# Patient Record
Sex: Male | Born: 1955
Health system: Southern US, Community
[De-identification: ages and names within clinical notes are randomized; demographics above are authoritative.]

## PROBLEM LIST (undated history)

## (undated) DIAGNOSIS — Z87448 Personal history of other diseases of urinary system: Secondary | ICD-10-CM

## (undated) HISTORY — DX: Personal history of other diseases of urinary system: Z87.448

## (undated) HISTORY — PX: EYE SURGERY: SHX253

---

## 2011-04-06 HISTORY — PX: FETAL SURGERY FOR CONGENITAL HERNIA: SHX1618

## 2015-08-29 DIAGNOSIS — L820 Inflamed seborrheic keratosis: Secondary | ICD-10-CM | POA: Diagnosis not present

## 2015-08-29 DIAGNOSIS — D225 Melanocytic nevi of trunk: Secondary | ICD-10-CM | POA: Diagnosis not present

## 2016-03-08 DIAGNOSIS — Z0001 Encounter for general adult medical examination with abnormal findings: Secondary | ICD-10-CM | POA: Diagnosis not present

## 2016-03-08 DIAGNOSIS — K5730 Diverticulosis of large intestine without perforation or abscess without bleeding: Secondary | ICD-10-CM | POA: Diagnosis not present

## 2016-03-08 DIAGNOSIS — R1031 Right lower quadrant pain: Secondary | ICD-10-CM | POA: Diagnosis not present

## 2016-03-08 DIAGNOSIS — E559 Vitamin D deficiency, unspecified: Secondary | ICD-10-CM | POA: Diagnosis not present

## 2016-03-08 DIAGNOSIS — N401 Enlarged prostate with lower urinary tract symptoms: Secondary | ICD-10-CM | POA: Diagnosis not present

## 2016-05-26 ENCOUNTER — Encounter (INDEPENDENT_AMBULATORY_CARE_PROVIDER_SITE_OTHER): Payer: Self-pay | Admitting: Orthopaedic Surgery

## 2016-05-26 ENCOUNTER — Ambulatory Visit (INDEPENDENT_AMBULATORY_CARE_PROVIDER_SITE_OTHER): Payer: BLUE CROSS/BLUE SHIELD | Admitting: Orthopaedic Surgery

## 2016-05-26 ENCOUNTER — Ambulatory Visit (INDEPENDENT_AMBULATORY_CARE_PROVIDER_SITE_OTHER): Payer: BLUE CROSS/BLUE SHIELD

## 2016-05-26 VITALS — Ht 72.0 in | Wt 180.0 lb

## 2016-05-26 DIAGNOSIS — M25561 Pain in right knee: Secondary | ICD-10-CM

## 2016-05-26 NOTE — Addendum Note (Signed)
Addended by: Maxcine Ham on: 05/26/2016 09:35 AM   Modules accepted: Orders

## 2016-05-26 NOTE — Progress Notes (Signed)
Office Visit Note   Patient: Devon Sawyer           Date of Birth: Jun 21, 1955           MRN: [id number] Visit Date: 05/26/2016              Requested by: No referring provider defined for this encounter. PCP: Henrine Screws, MD   Assessment & Plan: Visit Diagnoses:  1. Acute pain of right knee     Plan: Given the continued locking catching in the knee and the fact that his right knee does get stuck in the place an MRI is definitely warranted and medically necessary to rule out a meniscal tear. He had a large effusion about 3 weeks ago now it's gotten smaller with rest, ice, heat, time and anti-inflammatories however he still having the mechanical symptoms of locking and catching and feeling as if the knee is going to give way. Based on those findings as well as my clinical exam findings of a positive Murray sign to the lateral side we will obtain an MRI to rule out a meniscal tear.  Follow-Up Instructions: Return in about 2 weeks (around 06/09/2016).   Orders:  Orders Placed This Encounter  Procedures  . XR KNEE 3 VIEW RIGHT   No orders of the defined types were placed in this encounter.     Procedures: No procedures performed   Clinical Data: No additional findings.   Subjective: Chief Complaint  Patient presents with  . Right Knee - Pain    Prior meniscal tear injury when he was 61 y.o. No known recent injury. Popping and giving way when pivoting on right foot. Ongoing x 3 weeks. He complains of more discomfort than pain. Swelling.     HPI The patient reports a history of locking catching in his knee that occurred about 3 weeks ago. After this injury of the knee twisting on him the knee state a lot position until he could finally get it straight. This took several days for this to take place. The knee then popped in the place. He's had lateral joint line tenderness with episodes of locking catching available like the knee is going to give way. He also had report a  large effusion of that knee that is slowly gone down over the last 3 weeks however the knee still feels unstable to him. Review of Systems He denies any chest pain, headache, shortness of breath, fever, chills, nausea, vomiting.  Objective: Vital Signs: Ht 6' (1.829 m)   Wt 180 lb (81.6 kg)   BMI 24.41 kg/m   Physical Exam He is alert and oriented 3 and in no acute distress. He does walk with a limp. Ortho Exam Examination of both his knees is performed the office today. His right knee is slightly warm with a moderate effusion. He has a positive Murray sign to lateral side. His Lachman's is negative. His range of motion is full. Specialty Comments:  No specialty comments available.  Imaging: Xr Knee 3 View Right  Result Date: 05/26/2016 3 views of the right knee including AP lateral and sunrise views show well located knee. There is well-maintained joint spaces. There is no loose bodies and we can see. There is no acute bone injury or fracture.    PMFS History: There are no active problems to display for this patient.  Past Medical History:  Diagnosis Date  . H/O bladder problems     Family History  Problem Relation Age of Onset  .  Cancer Father     Past Surgical History:  Procedure Laterality Date  . EYE SURGERY  2016,2010,2005  . FETAL SURGERY FOR CONGENITAL HERNIA  2013   Social History   Occupational History  . Not on file.   Social History Main Topics  . Smoking status: Never Smoker  . Smokeless tobacco: Never Used  . Alcohol use Yes  . Drug use: No  . Sexual activity: Not on file

## 2016-06-08 ENCOUNTER — Ambulatory Visit
Admission: RE | Admit: 2016-06-08 | Discharge: 2016-06-08 | Disposition: A | Discharge status: Home / Self Care | Payer: BLUE CROSS/BLUE SHIELD | Source: Ambulatory Visit | Attending: Orthopaedic Surgery | Admitting: Orthopaedic Surgery

## 2016-06-08 DIAGNOSIS — M25561 Pain in right knee: Secondary | ICD-10-CM

## 2016-06-09 ENCOUNTER — Encounter (INDEPENDENT_AMBULATORY_CARE_PROVIDER_SITE_OTHER): Payer: Self-pay | Admitting: Orthopaedic Surgery

## 2016-06-09 ENCOUNTER — Ambulatory Visit (INDEPENDENT_AMBULATORY_CARE_PROVIDER_SITE_OTHER): Payer: BLUE CROSS/BLUE SHIELD | Admitting: Orthopaedic Surgery

## 2016-06-09 DIAGNOSIS — S83241A Other tear of medial meniscus, current injury, right knee, initial encounter: Secondary | ICD-10-CM | POA: Insufficient documentation

## 2016-06-09 NOTE — Progress Notes (Signed)
   Office Visit Note   Patient: Devon Sawyer           Date of Birth: 03-13-1956           MRN: [id number] Visit Date: 06/09/2016              Requested by: Josetta Huddle, MD [address], [place] [address] PCP: Henrine Screws, MD   Assessment & Plan: Visit Diagnoses:  1. Acute medial meniscus tear of right knee, initial encounter     Plan: Given that his right knee medial meniscal tear is so significant and it's such a large bucket-handle tear we are recommending an arthroscopic intervention. We went over her knee model showed him several knee images and videos of knee arthroscopic surgery on line for him to look at. With a long thorough discussion about the risks and benefits of surgery recommendation for this. I gave him our surgery scheduler's card and he'll let us know when he decides to have this done. We talked about the intraoperative and postoperative courses well.  Follow-Up Instructions: Return if symptoms worsen or fail to improve.   Orders:  No orders of the defined types were placed in this encounter.  No orders of the defined types were placed in this encounter.     Procedures: No procedures performed   Clinical Data: No additional findings.   Subjective: Chief Complaint  Patient presents with  . Right Knee - Follow-up    MRI review right knee     HPI He is coming in for review of MRI of his right knee. He been having pain on the medial joint line with locking catching of that knee. Review of Systems   Objective: Vital Signs: There were no vitals taken for this visit.  Physical Exam Alert and oriented 3 Ortho Exam The right knee has medial joint line tenderness and a positive Murray sign to the medial side. Specialty Comments:  No specialty comments available.  Imaging: No results found. I independently reviewed his right knee MRI and went over with him. He has a large bucket-handle complex comminuted tear of the  medial meniscus. The overlying cartilage in the medial compartment is intact and pristine. The remainder of his knee appears normal  PMFS History: Patient Active Problem List   Diagnosis Date Noted  . Acute medial meniscus tear of right knee 06/09/2016   Past Medical History:  Diagnosis Date  . H/O bladder problems     Family History  Problem Relation Age of Onset  . Cancer Father     Past Surgical History:  Procedure Laterality Date  . EYE SURGERY  2016,2010,2005  . FETAL SURGERY FOR CONGENITAL HERNIA  2013   Social History   Occupational History  . Not on file.   Social History Main Topics  . Smoking status: Never Smoker  . Smokeless tobacco: Never Used  . Alcohol use Yes  . Drug use: No  . Sexual activity: Not on file

## 2016-06-30 ENCOUNTER — Other Ambulatory Visit: Payer: Self-pay | Admitting: Gastroenterology

## 2016-07-01 ENCOUNTER — Other Ambulatory Visit: Payer: Self-pay | Admitting: Gastroenterology

## 2016-07-08 ENCOUNTER — Encounter (HOSPITAL_COMMUNITY): Payer: Self-pay

## 2016-07-08 ENCOUNTER — Ambulatory Visit (HOSPITAL_COMMUNITY): Admit: 2016-07-08 | Payer: BLUE CROSS/BLUE SHIELD | Admitting: Gastroenterology

## 2016-07-08 SURGERY — COLONOSCOPY WITH PROPOFOL
Anesthesia: Monitor Anesthesia Care

## 2016-08-05 ENCOUNTER — Encounter (HOSPITAL_COMMUNITY): Payer: Self-pay | Admitting: *Deleted

## 2016-08-09 ENCOUNTER — Ambulatory Visit (HOSPITAL_COMMUNITY)
Admission: RE | Admit: 2016-08-09 | Discharge: 2016-08-09 | Disposition: A | Discharge status: Home / Self Care | Payer: BLUE CROSS/BLUE SHIELD | Source: Ambulatory Visit | Attending: Gastroenterology | Admitting: Gastroenterology

## 2016-08-09 ENCOUNTER — Ambulatory Visit (HOSPITAL_COMMUNITY): Payer: BLUE CROSS/BLUE SHIELD | Admitting: Anesthesiology

## 2016-08-09 ENCOUNTER — Encounter (HOSPITAL_COMMUNITY): Payer: Self-pay | Admitting: *Deleted

## 2016-08-09 ENCOUNTER — Encounter (HOSPITAL_COMMUNITY)
Admission: RE | Disposition: A | Discharge status: Home / Self Care | Payer: Self-pay | Source: Ambulatory Visit | Attending: Gastroenterology

## 2016-08-09 DIAGNOSIS — L719 Rosacea, unspecified: Secondary | ICD-10-CM | POA: Diagnosis not present

## 2016-08-09 DIAGNOSIS — S83241A Other tear of medial meniscus, current injury, right knee, initial encounter: Secondary | ICD-10-CM | POA: Diagnosis not present

## 2016-08-09 DIAGNOSIS — Z1211 Encounter for screening for malignant neoplasm of colon: Secondary | ICD-10-CM | POA: Diagnosis not present

## 2016-08-09 DIAGNOSIS — N400 Enlarged prostate without lower urinary tract symptoms: Secondary | ICD-10-CM | POA: Insufficient documentation

## 2016-08-09 DIAGNOSIS — Z880 Allergy status to penicillin: Secondary | ICD-10-CM | POA: Insufficient documentation

## 2016-08-09 DIAGNOSIS — K5730 Diverticulosis of large intestine without perforation or abscess without bleeding: Secondary | ICD-10-CM | POA: Insufficient documentation

## 2016-08-09 HISTORY — PX: COLONOSCOPY WITH PROPOFOL: SHX5780

## 2016-08-09 SURGERY — COLONOSCOPY WITH PROPOFOL
Anesthesia: Monitor Anesthesia Care

## 2016-08-09 MED ORDER — LACTATED RINGERS IV SOLN
INTRAVENOUS | Status: DC
  Administered 2016-08-09: 1000 mL via INTRAVENOUS

## 2016-08-09 MED ORDER — PROPOFOL 10 MG/ML IV BOLUS
INTRAVENOUS | Status: AC
  Filled 2016-08-09: qty 40

## 2016-08-09 MED ORDER — PROPOFOL 10 MG/ML IV BOLUS
INTRAVENOUS | Status: DC | PRN
  Administered 2016-08-09: 10 mg via INTRAVENOUS
  Administered 2016-08-09: 40 mg via INTRAVENOUS

## 2016-08-09 MED ORDER — EPHEDRINE SULFATE-NACL 50-0.9 MG/10ML-% IV SOSY
PREFILLED_SYRINGE | INTRAVENOUS | Status: DC | PRN
  Administered 2016-08-09 (×5): 10 mg via INTRAVENOUS

## 2016-08-09 MED ORDER — LIDOCAINE 2% (20 MG/ML) 5 ML SYRINGE
INTRAMUSCULAR | Status: DC | PRN
  Administered 2016-08-09: 100 mg via INTRAVENOUS

## 2016-08-09 MED ORDER — SODIUM CHLORIDE 0.9 % IV SOLN: INTRAVENOUS | Status: DC

## 2016-08-09 MED ORDER — LIDOCAINE 2% (20 MG/ML) 5 ML SYRINGE
INTRAMUSCULAR | Status: AC
  Filled 2016-08-09: qty 5

## 2016-08-09 MED ORDER — PROPOFOL 500 MG/50ML IV EMUL
INTRAVENOUS | Status: DC | PRN
  Administered 2016-08-09: 150 ug/kg/min via INTRAVENOUS

## 2016-08-09 SURGICAL SUPPLY — 21 items

## 2016-08-09 NOTE — Op Note (Signed)
[facility] Patient Name: Devon Sawyer Procedure Date: 08/09/2016 MRN: [id number] Attending MD: Garlan Fair , MD Date of Birth: 03/29/1956 CSN: [id number] Age: 61 Admit Type: Outpatient Procedure:                Colonoscopy Indications:              Screening for colorectal malignant neoplasm Providers:                Garlan Fair, MD, Elmer Ramp. Tilden Dome, RN, Ralene Bathe, Technician Referring MD:              Medicines:                Propofol per Anesthesia Complications:            No immediate complications. Estimated Blood Loss:     Estimated blood loss: none. Procedure:                Pre-Anesthesia Assessment:                           - Prior to the procedure, a History and Physical                            was performed, and patient medications and                            allergies were reviewed. The patient's tolerance of                            previous anesthesia was also reviewed. The risks                            and benefits of the procedure and the sedation                            options and risks were discussed with the patient.                            All questions were answered, and informed consent                            was obtained. Prior Anticoagulants: The patient has                            taken no previous anticoagulant or antiplatelet                            agents. ASA Grade Assessment: II - A patient with                            mild systemic disease. After reviewing the risks  and benefits, the patient was deemed in                            satisfactory condition to undergo the procedure.                           After obtaining informed consent, the colonoscope                            was passed under direct vision. Throughout the                            procedure, the patient's blood pressure, pulse, and                            oxygen  saturations were monitored continuously. The                            EC-[id number] ([id number]) scope was introduced through                            the anus and advanced to the the cecum, identified                            by appendiceal orifice and ileocecal valve. The                            colonoscopy was performed without difficulty. The                            patient tolerated the procedure well. The quality                            of the bowel preparation was good. The terminal                            ileum, the ileocecal valve, the appendiceal orifice                            and the rectum were photographed. Scope In: 1:23:13 PM Scope Out: 1:44:40 PM Scope Withdrawal Time: 0 hours 10 minutes 55 seconds  Total Procedure Duration: 0 hours 21 minutes 27 seconds  Findings:      The perianal and digital rectal examinations were normal.      The entire examined colon appeared normal. Left colonic diverticulosis       was present. Impression:               - The entire examined colon is normal.                           - No specimens collected. Moderate Sedation:      N/A- Per Anesthesia Care Recommendation:           - Patient has a contact number available for  emergencies. The signs and symptoms of potential                            delayed complications were discussed with the                            patient. Return to normal activities tomorrow.                            Written discharge instructions were provided to the                            patient.                           - Repeat colonoscopy in 10 years for screening                            purposes.                           - Resume previous diet.                           - Continue present medications. Procedure Code(s):        --- Professional ---                           R1540, Colorectal cancer screening; colonoscopy on                             individual not meeting criteria for high risk Diagnosis Code(s):        --- Professional ---                           Z12.11, Encounter for screening for malignant                            neoplasm of colon CPT copyright 2016 American Medical Association. All rights reserved. The codes documented in this report are preliminary and upon coder review may  be revised to meet current compliance requirements. Earle Gell, MD Garlan Fair, MD 08/09/2016 1:50:45 PM This report has been signed electronically. Number of Addenda: 0

## 2016-08-09 NOTE — H&P (Signed)
Procedure: Screening colonoscopy. Normal screening colonoscopy was performed on 06/15/2006  History: The patient is a 61 year old male born 1956-01-16. He is scheduled to undergo a repeat screening colonoscopy today.  Past medical history: Benign prostatic hypertrophy. Probable sigmoid colon diverticulitis diagnosed in September 1999. Rosacea. Bilateral inguinal hernia repair surgery.  Family history: Father was diagnosed with prostate cancer  Medication allergies: Penicillin causes itching  Exam: The patient is alert and lying comfortably on the endoscopy stretcher. Abdomen is soft and nontender to palpation. Cardiac exam reveals a regular rhythm. Lungs are clear to auscultation.  Plan: Proceed with screening colonoscopy

## 2016-08-09 NOTE — Discharge Instructions (Signed)

## 2016-08-09 NOTE — Anesthesia Postprocedure Evaluation (Addendum)
Anesthesia Post Note  Patient: Devon Sawyer  Procedure(s) Performed: Procedure(s) (LRB): COLONOSCOPY WITH PROPOFOL (N/A)  Patient location during evaluation: PACU Anesthesia Type: MAC Level of consciousness: awake, awake and alert and oriented Pain management: pain level controlled Vital Signs Assessment: post-procedure vital signs reviewed and stable Respiratory status: spontaneous breathing, nonlabored ventilation and respiratory function stable Cardiovascular status: blood pressure returned to baseline Anesthetic complications: no       Last Vitals:  Vitals:   08/09/16 1410 08/09/16 1414  BP: (!) 113/52   Pulse: 83 79  Resp: 17 15  Temp:      Last Pain:  Vitals:   08/09/16 1356  TempSrc: Oral                 Kees Idrovo,Leovanni COKER

## 2016-08-09 NOTE — Transfer of Care (Signed)
Immediate Anesthesia Transfer of Care Note  Patient: Devon Sawyer  Procedure(s) Performed: Procedure(s): COLONOSCOPY WITH PROPOFOL (N/A)  Patient Location: PACU  Anesthesia Type:MAC  Level of Consciousness: awake, alert  and oriented  Airway & Oxygen Therapy: Patient Spontanous Breathing and Patient connected to face mask oxygen  Post-op Assessment: Report given to RN and Post -op Vital signs reviewed and stable  Post vital signs: Reviewed and stable  Last Vitals:  Vitals:   08/09/16 1234 08/09/16 1356  BP: 129/82 (!) 105/48  Pulse: 72   Resp: 11 16  Temp: 36.4 C 36.3 C    Last Pain:  Vitals:   08/09/16 1356  TempSrc: Oral         Complications: No apparent anesthesia complications

## 2016-08-09 NOTE — Anesthesia Preprocedure Evaluation (Signed)
Anesthesia Evaluation  Patient identified by MRN, date of birth, ID band Patient awake    Reviewed: Allergy & Precautions, NPO status , Patient's Chart, lab work & pertinent test results  Airway Mallampati: II  TM Distance: >3 FB Neck ROM: Full    Dental  (+) Teeth Intact, Dental Advisory Given   Pulmonary    breath sounds clear to auscultation       Cardiovascular  Rhythm:Regular Rate:Normal     Neuro/Psych    GI/Hepatic   Endo/Other    Renal/GU      Musculoskeletal   Abdominal   Peds  Hematology   Anesthesia Other Findings   Reproductive/Obstetrics                             Anesthesia Physical Anesthesia Plan  ASA: I  Anesthesia Plan: MAC   Post-op Pain Management:    Induction: Intravenous  Airway Management Planned: Simple Face Mask and Natural Airway  Additional Equipment:   Intra-op Plan:   Post-operative Plan:   Informed Consent: I have reviewed the patients History and Physical, chart, labs and discussed the procedure including the risks, benefits and alternatives for the proposed anesthesia with the patient or authorized representative who has indicated his/her understanding and acceptance.     Plan Discussed with: CRNA and Anesthesiologist  Anesthesia Plan Comments:         Anesthesia Quick Evaluation

## 2016-08-12 ENCOUNTER — Encounter (HOSPITAL_COMMUNITY): Payer: Self-pay | Admitting: Gastroenterology

## 2016-11-25 NOTE — Addendum Note (Signed)
Addendum  created 11/25/16 1412 by Roberts Gaudy, MD   Sign clinical note

## 2017-01-26 DIAGNOSIS — Z1283 Encounter for screening for malignant neoplasm of skin: Secondary | ICD-10-CM | POA: Diagnosis not present

## 2017-01-26 DIAGNOSIS — D225 Melanocytic nevi of trunk: Secondary | ICD-10-CM | POA: Diagnosis not present

## 2017-03-18 DIAGNOSIS — N401 Enlarged prostate with lower urinary tract symptoms: Secondary | ICD-10-CM | POA: Diagnosis not present

## 2017-03-18 DIAGNOSIS — Z13220 Encounter for screening for lipoid disorders: Secondary | ICD-10-CM | POA: Diagnosis not present

## 2017-03-18 DIAGNOSIS — Z0001 Encounter for general adult medical examination with abnormal findings: Secondary | ICD-10-CM | POA: Diagnosis not present

## 2017-03-18 DIAGNOSIS — E559 Vitamin D deficiency, unspecified: Secondary | ICD-10-CM | POA: Diagnosis not present

## 2017-03-18 DIAGNOSIS — Z79899 Other long term (current) drug therapy: Secondary | ICD-10-CM | POA: Diagnosis not present

## 2017-03-18 DIAGNOSIS — K4021 Bilateral inguinal hernia, without obstruction or gangrene, recurrent: Secondary | ICD-10-CM | POA: Diagnosis not present

## 2017-03-18 DIAGNOSIS — K5730 Diverticulosis of large intestine without perforation or abscess without bleeding: Secondary | ICD-10-CM | POA: Diagnosis not present

## 2018-03-08 DIAGNOSIS — E559 Vitamin D deficiency, unspecified: Secondary | ICD-10-CM | POA: Diagnosis not present

## 2018-03-08 DIAGNOSIS — N401 Enlarged prostate with lower urinary tract symptoms: Secondary | ICD-10-CM | POA: Diagnosis not present

## 2018-03-08 DIAGNOSIS — Z8042 Family history of malignant neoplasm of prostate: Secondary | ICD-10-CM | POA: Diagnosis not present

## 2018-03-08 DIAGNOSIS — Z13220 Encounter for screening for lipoid disorders: Secondary | ICD-10-CM | POA: Diagnosis not present

## 2018-03-08 DIAGNOSIS — Z0001 Encounter for general adult medical examination with abnormal findings: Secondary | ICD-10-CM | POA: Diagnosis not present

## 2018-06-20 DIAGNOSIS — R509 Fever, unspecified: Secondary | ICD-10-CM | POA: Diagnosis not present

## 2018-06-20 DIAGNOSIS — R05 Cough: Secondary | ICD-10-CM | POA: Diagnosis not present

## 2018-06-29 DIAGNOSIS — R05 Cough: Secondary | ICD-10-CM | POA: Diagnosis not present

## 2018-07-12 DIAGNOSIS — D485 Neoplasm of uncertain behavior of skin: Secondary | ICD-10-CM | POA: Diagnosis not present

## 2018-07-12 DIAGNOSIS — D225 Melanocytic nevi of trunk: Secondary | ICD-10-CM | POA: Diagnosis not present

## 2018-07-12 DIAGNOSIS — L821 Other seborrheic keratosis: Secondary | ICD-10-CM | POA: Diagnosis not present

## 2018-08-09 DIAGNOSIS — R05 Cough: Secondary | ICD-10-CM | POA: Diagnosis not present

## 2018-11-05 DIAGNOSIS — T63481A Toxic effect of venom of other arthropod, accidental (unintentional), initial encounter: Secondary | ICD-10-CM | POA: Diagnosis not present

## 2019-03-21 ENCOUNTER — Other Ambulatory Visit: Payer: Self-pay | Admitting: Internal Medicine

## 2019-03-21 ENCOUNTER — Ambulatory Visit
Admission: RE | Admit: 2019-03-21 | Discharge: 2019-03-21 | Disposition: A | Discharge status: Home / Self Care | Payer: BLUE CROSS/BLUE SHIELD | Source: Ambulatory Visit | Attending: Internal Medicine | Admitting: Internal Medicine

## 2019-03-21 DIAGNOSIS — Z0001 Encounter for general adult medical examination with abnormal findings: Secondary | ICD-10-CM | POA: Diagnosis not present

## 2019-03-21 DIAGNOSIS — Z23 Encounter for immunization: Secondary | ICD-10-CM | POA: Diagnosis not present

## 2019-03-21 DIAGNOSIS — M4802 Spinal stenosis, cervical region: Secondary | ICD-10-CM | POA: Diagnosis not present

## 2019-03-21 DIAGNOSIS — R002 Palpitations: Secondary | ICD-10-CM | POA: Diagnosis not present

## 2019-03-21 DIAGNOSIS — M5412 Radiculopathy, cervical region: Secondary | ICD-10-CM

## 2019-03-21 DIAGNOSIS — Z125 Encounter for screening for malignant neoplasm of prostate: Secondary | ICD-10-CM | POA: Diagnosis not present

## 2019-03-21 DIAGNOSIS — E559 Vitamin D deficiency, unspecified: Secondary | ICD-10-CM | POA: Diagnosis not present

## 2019-03-21 DIAGNOSIS — R209 Unspecified disturbances of skin sensation: Secondary | ICD-10-CM | POA: Diagnosis not present

## 2019-03-21 DIAGNOSIS — Z79899 Other long term (current) drug therapy: Secondary | ICD-10-CM | POA: Diagnosis not present

## 2019-03-21 DIAGNOSIS — Z8042 Family history of malignant neoplasm of prostate: Secondary | ICD-10-CM | POA: Diagnosis not present

## 2019-03-21 DIAGNOSIS — K4021 Bilateral inguinal hernia, without obstruction or gangrene, recurrent: Secondary | ICD-10-CM | POA: Diagnosis not present

## 2019-03-21 DIAGNOSIS — I341 Nonrheumatic mitral (valve) prolapse: Secondary | ICD-10-CM | POA: Diagnosis not present

## 2019-03-21 DIAGNOSIS — Z13220 Encounter for screening for lipoid disorders: Secondary | ICD-10-CM | POA: Diagnosis not present

## 2019-03-22 ENCOUNTER — Other Ambulatory Visit: Payer: Self-pay | Admitting: Internal Medicine

## 2019-03-22 ENCOUNTER — Other Ambulatory Visit (HOSPITAL_COMMUNITY): Payer: Self-pay | Admitting: Internal Medicine

## 2019-03-22 DIAGNOSIS — R079 Chest pain, unspecified: Secondary | ICD-10-CM

## 2019-03-22 DIAGNOSIS — I341 Nonrheumatic mitral (valve) prolapse: Secondary | ICD-10-CM

## 2019-04-02 ENCOUNTER — Ambulatory Visit (HOSPITAL_COMMUNITY): Payer: PPO | Attending: Cardiology

## 2019-04-02 ENCOUNTER — Other Ambulatory Visit: Payer: PPO

## 2019-04-02 ENCOUNTER — Other Ambulatory Visit: Payer: Self-pay

## 2019-04-02 DIAGNOSIS — I341 Nonrheumatic mitral (valve) prolapse: Secondary | ICD-10-CM | POA: Insufficient documentation

## 2019-04-03 ENCOUNTER — Ambulatory Visit
Admission: RE | Admit: 2019-04-03 | Discharge: 2019-04-03 | Disposition: A | Discharge status: Home / Self Care | Payer: PPO | Source: Ambulatory Visit | Attending: Internal Medicine | Admitting: Internal Medicine

## 2019-04-03 DIAGNOSIS — R079 Chest pain, unspecified: Secondary | ICD-10-CM

## 2019-05-26 ENCOUNTER — Ambulatory Visit: Payer: PPO | Attending: Internal Medicine

## 2019-05-26 DIAGNOSIS — Z23 Encounter for immunization: Secondary | ICD-10-CM

## 2019-05-26 NOTE — Progress Notes (Signed)
   Covid-19 Vaccination Clinic  Name:  Devon Sawyer    MRN: [id number] DOB: [birth year]  05/26/2019  Devon Sawyer was observed post Covid-19 immunization for 15 minutes without incidence. He was provided with Vaccine Information Sheet and instruction to access the V-Safe system.   Devon Sawyer was instructed to call 911 with any severe reactions post vaccine: Marland Kitchen Difficulty breathing  . Swelling of your face and throat  . A fast heartbeat  . A bad rash all over your body  . Dizziness and weakness    Immunizations Administered    Name Date Dose VIS Date Route   Pfizer COVID-19 Vaccine 05/26/2019  8:25 AM 0.3 mL 03/16/2019 Intramuscular   Manufacturer: Lib[place] Negron Torres   Lot: [id number]   [place]: [id number]

## 2019-06-18 ENCOUNTER — Ambulatory Visit: Payer: PPO | Attending: Internal Medicine

## 2019-06-18 DIAGNOSIS — Z23 Encounter for immunization: Secondary | ICD-10-CM

## 2019-06-18 NOTE — Progress Notes (Signed)
   Covid-19 Vaccination Clinic  Name:  Devon Sawyer    MRN: [id number] DOB: [birth year]  06/18/2019  [name] was observed post Covid-19 immunization for 15 minutes without incident. He was provided with Vaccine Information Sheet and instruction to access the V-Safe system.   [name] was instructed to call 911 with any severe reactions post vaccine: Marland Kitchen Difficulty breathing  . Swelling of face and throat  . A fast heartbeat  . A bad rash all over body  . Dizziness and weakness   Immunizations Administered    Name Date Dose VIS Date Route   Pfizer COVID-19 Vaccine 06/18/2019  8:17 AM 0.3 mL 03/16/2019 Intramuscular   Manufacturer: [place]   Lot: [id number]   [place]: [id number]

## 2020-04-22 DIAGNOSIS — D2272 Melanocytic nevi of left lower limb, including hip: Secondary | ICD-10-CM | POA: Diagnosis not present

## 2020-04-22 DIAGNOSIS — Z1283 Encounter for screening for malignant neoplasm of skin: Secondary | ICD-10-CM | POA: Diagnosis not present

## 2020-04-22 DIAGNOSIS — D485 Neoplasm of uncertain behavior of skin: Secondary | ICD-10-CM | POA: Diagnosis not present

## 2020-04-22 DIAGNOSIS — L989 Disorder of the skin and subcutaneous tissue, unspecified: Secondary | ICD-10-CM | POA: Diagnosis not present

## 2020-04-22 DIAGNOSIS — D225 Melanocytic nevi of trunk: Secondary | ICD-10-CM | POA: Diagnosis not present

## 2020-04-24 DIAGNOSIS — M5412 Radiculopathy, cervical region: Secondary | ICD-10-CM | POA: Diagnosis not present

## 2020-04-24 DIAGNOSIS — K4021 Bilateral inguinal hernia, without obstruction or gangrene, recurrent: Secondary | ICD-10-CM | POA: Diagnosis not present

## 2020-04-24 DIAGNOSIS — Z8042 Family history of malignant neoplasm of prostate: Secondary | ICD-10-CM | POA: Diagnosis not present

## 2020-04-24 DIAGNOSIS — N401 Enlarged prostate with lower urinary tract symptoms: Secondary | ICD-10-CM | POA: Diagnosis not present

## 2020-04-24 DIAGNOSIS — Z79899 Other long term (current) drug therapy: Secondary | ICD-10-CM | POA: Diagnosis not present

## 2020-04-24 DIAGNOSIS — Z0001 Encounter for general adult medical examination with abnormal findings: Secondary | ICD-10-CM | POA: Diagnosis not present

## 2020-04-24 DIAGNOSIS — K5730 Diverticulosis of large intestine without perforation or abscess without bleeding: Secondary | ICD-10-CM | POA: Diagnosis not present

## 2020-04-24 DIAGNOSIS — Z23 Encounter for immunization: Secondary | ICD-10-CM | POA: Diagnosis not present

## 2020-04-24 DIAGNOSIS — I341 Nonrheumatic mitral (valve) prolapse: Secondary | ICD-10-CM | POA: Diagnosis not present

## 2020-04-24 DIAGNOSIS — Z136 Encounter for screening for cardiovascular disorders: Secondary | ICD-10-CM | POA: Diagnosis not present

## 2020-04-24 DIAGNOSIS — E559 Vitamin D deficiency, unspecified: Secondary | ICD-10-CM | POA: Diagnosis not present

## 2020-04-24 DIAGNOSIS — R209 Unspecified disturbances of skin sensation: Secondary | ICD-10-CM | POA: Diagnosis not present

## 2020-05-13 DIAGNOSIS — D485 Neoplasm of uncertain behavior of skin: Secondary | ICD-10-CM | POA: Diagnosis not present

## 2020-05-13 DIAGNOSIS — D2272 Melanocytic nevi of left lower limb, including hip: Secondary | ICD-10-CM | POA: Diagnosis not present

## 2020-05-13 DIAGNOSIS — D225 Melanocytic nevi of trunk: Secondary | ICD-10-CM | POA: Diagnosis not present

## 2020-05-13 DIAGNOSIS — L989 Disorder of the skin and subcutaneous tissue, unspecified: Secondary | ICD-10-CM | POA: Diagnosis not present

## 2020-06-09 DIAGNOSIS — M13849 Other specified arthritis, unspecified hand: Secondary | ICD-10-CM | POA: Diagnosis not present

## 2020-06-09 DIAGNOSIS — M79641 Pain in right hand: Secondary | ICD-10-CM | POA: Diagnosis not present

## 2020-06-29 IMAGING — DX DG CERVICAL SPINE 2 OR 3 VIEWS
3 series · 3 of 3 positions shown · non-contrast
Comparison: None.

CLINICAL DATA: Upper extremity radicular symptoms

EXAM:
CERVICAL SPINE - 2-3 VIEW

[dg cervical spine 2 or 3 views (1 of 3)]
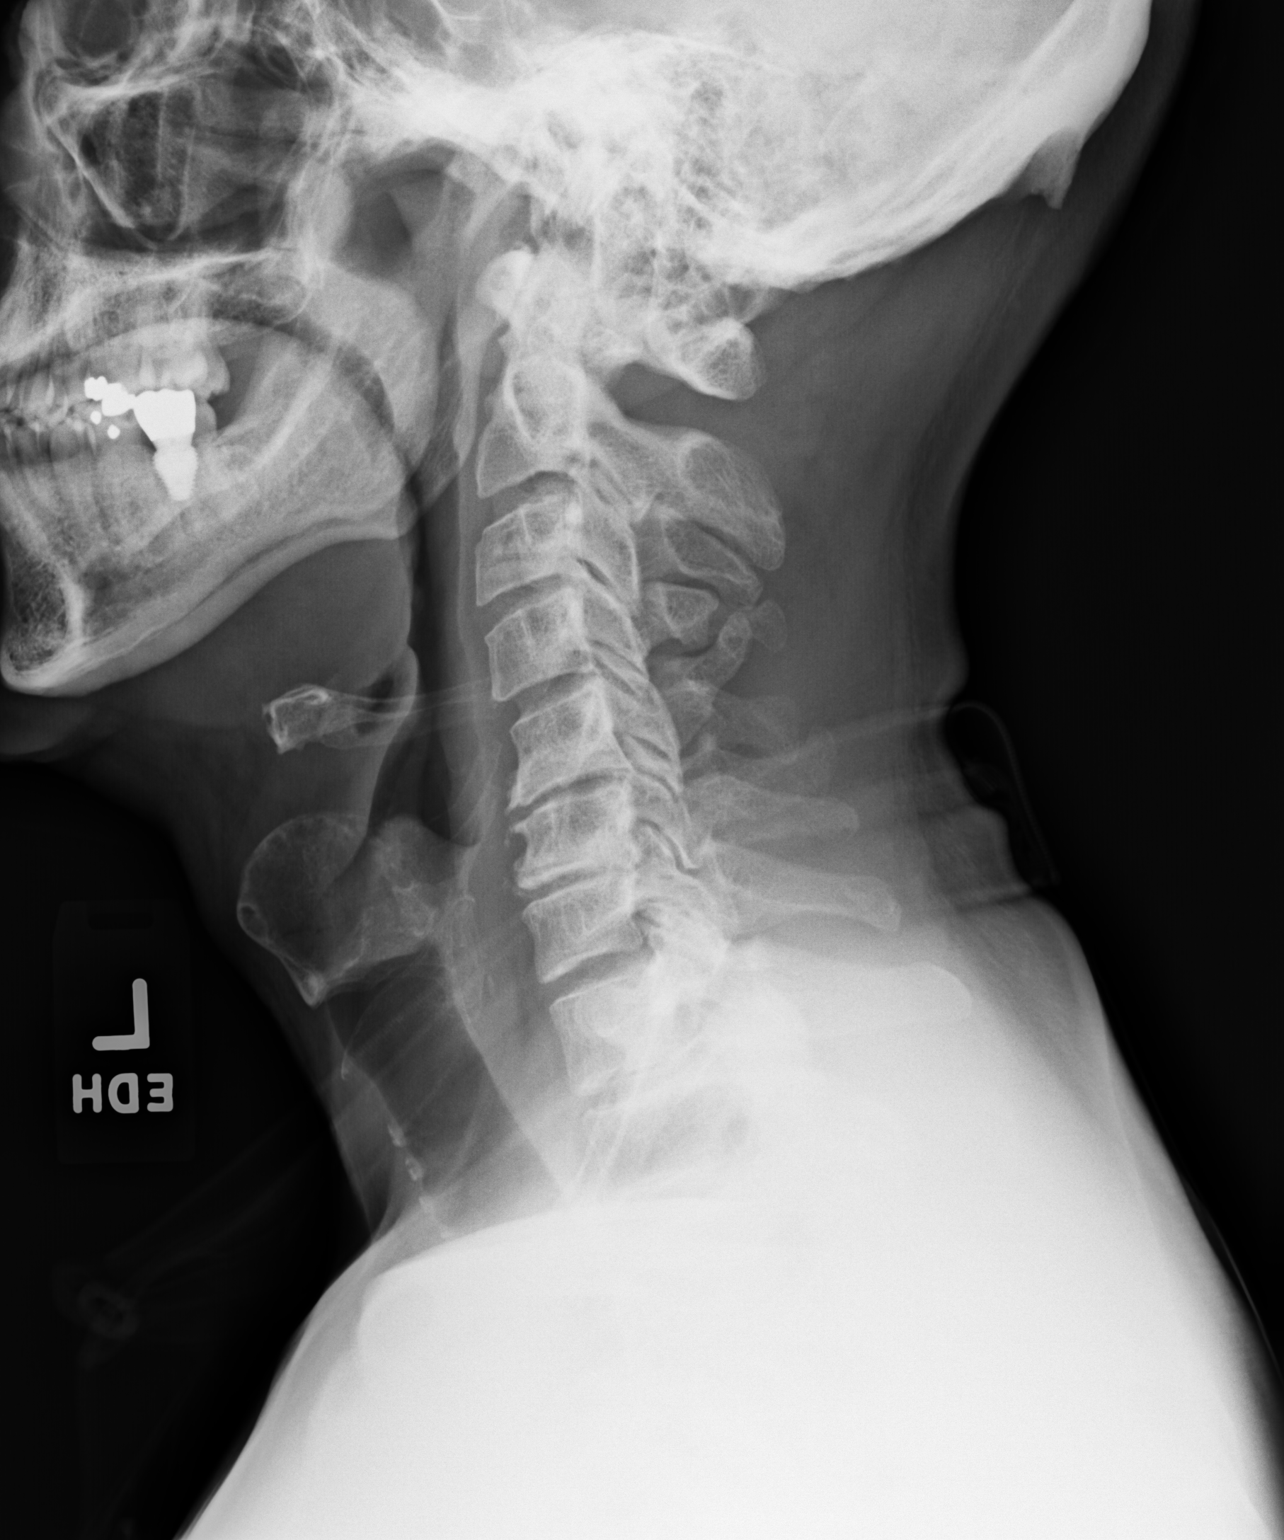

[dg cervical spine 2 or 3 views (2 of 3)]
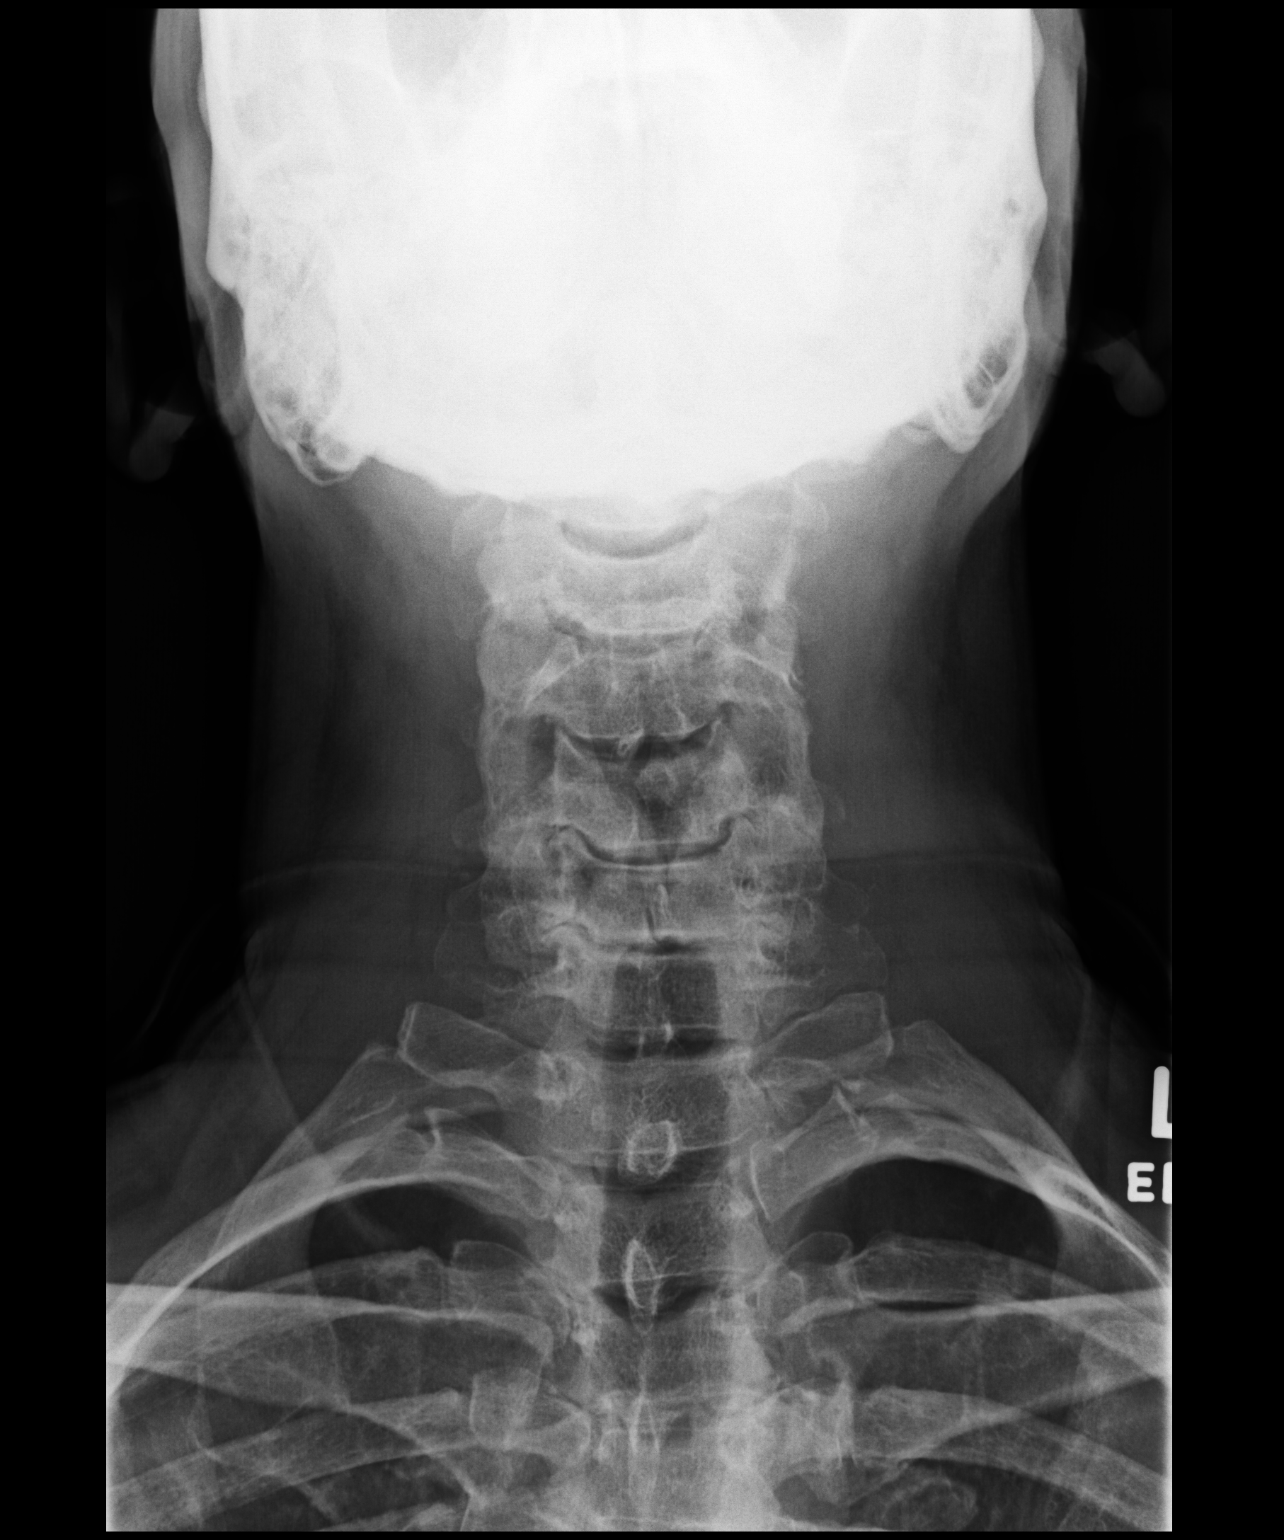

[dg cervical spine 2 or 3 views (3 of 3)]
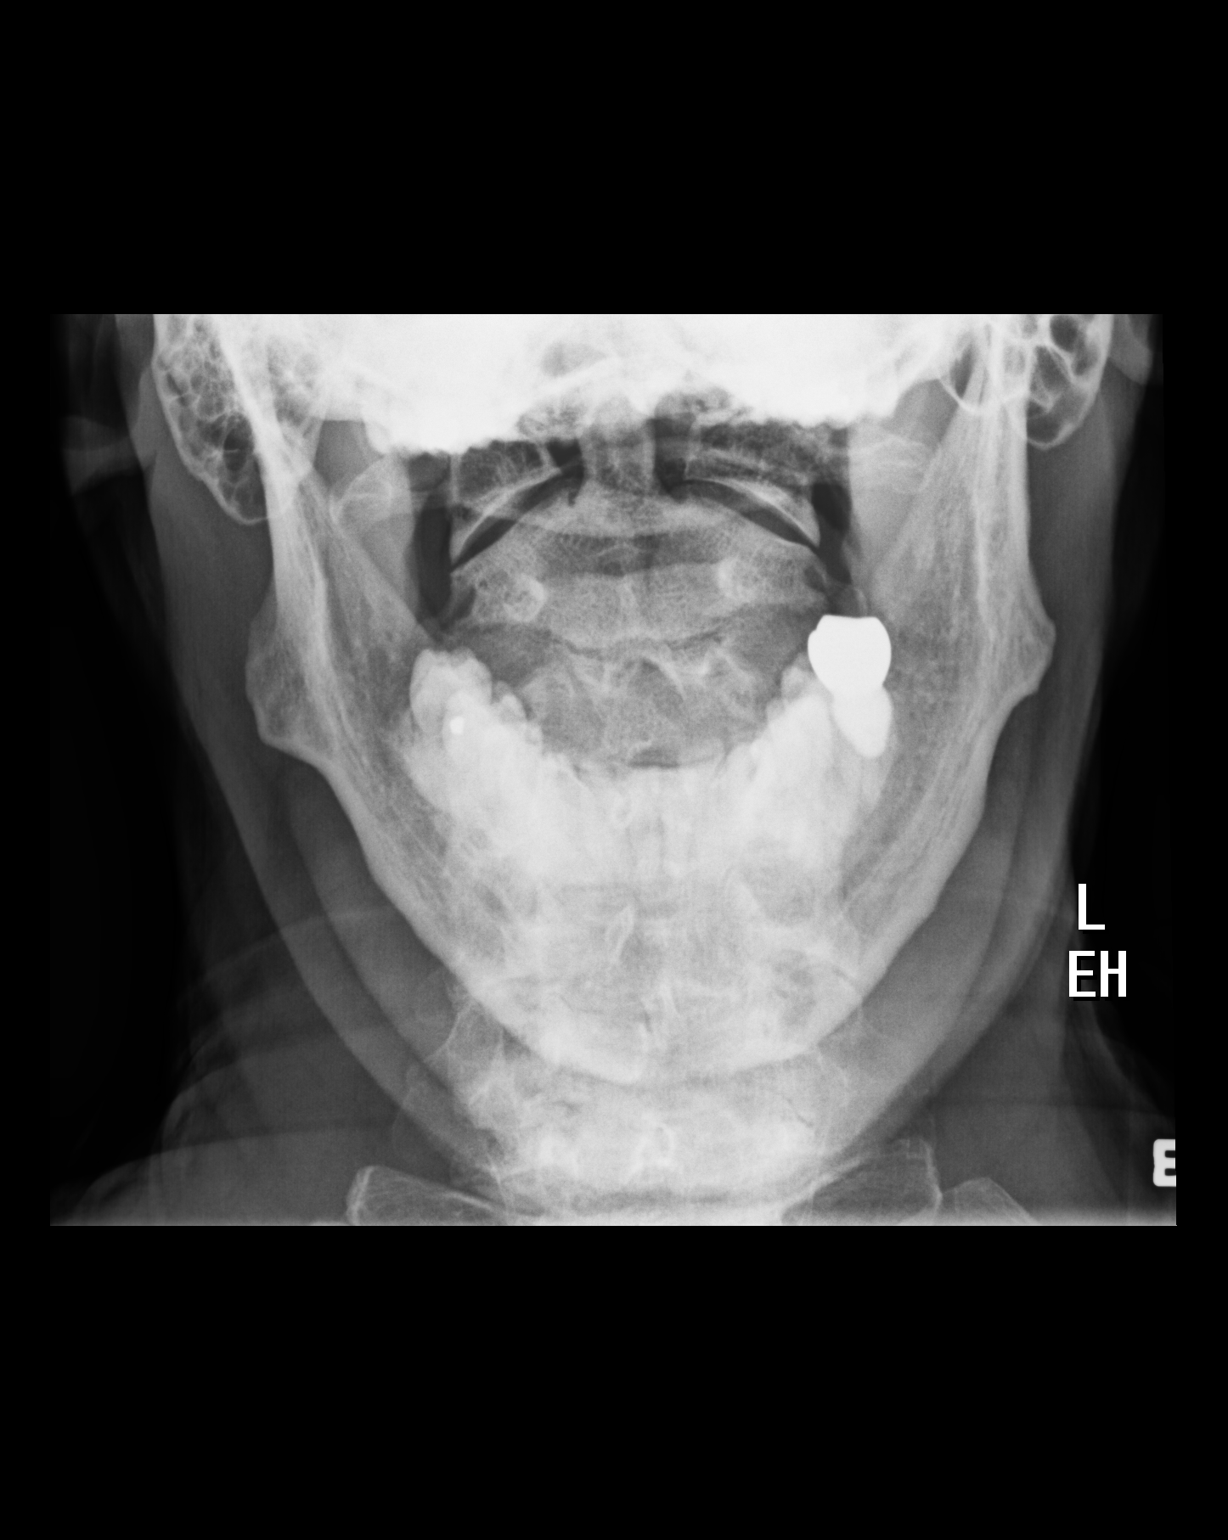

[3 of 3 positions shown; findings below may reference images not displayed]

FINDINGS: Frontal, lateral, and open-mouth odontoid images were obtained.
There is no fracture or spondylolisthesis. Prevertebral soft tissues
and predental space regions are normal. There is severe disc space
narrowing at C5-6 and C6-7. There is slight disc space narrowing at
C3-4. There are anterior osteophytes at C5 and C6. No erosive
change. Lung apices are clear.
IMPRESSION: Osteoarthritic change with severe disc space narrowing at C5-6 and
C6-7. Slight disc space narrowing at C3-4. No fracture or
spondylolisthesis evident.

## 2020-07-12 IMAGING — CT CT HEART SCORING
3 series · 14 of 20 positions shown, 16 images · non-contrast
Comparison: None.

CLINICAL DATA: Hyperlipidemia

EXAM:
CT HEART FOR CALCIUM SCORING
TECHNIQUE: CT heart was performed using prospective ECG gating.
A non-contrast exam for calcium scoring was performed.
Note that this exam targets the heart and the chest was not imaged
in its entirety.

[Series 2: calcium scoring 2.00 qr36 bestdiast 68% · axial · 0.41mm/px · z∈[+1717,+1807]mm · 4 of 77 slices shown]
[im 16/77  vessel]
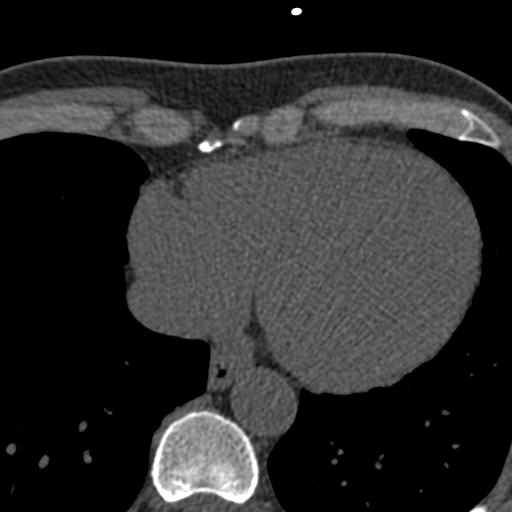
[im 31/77  vessel]
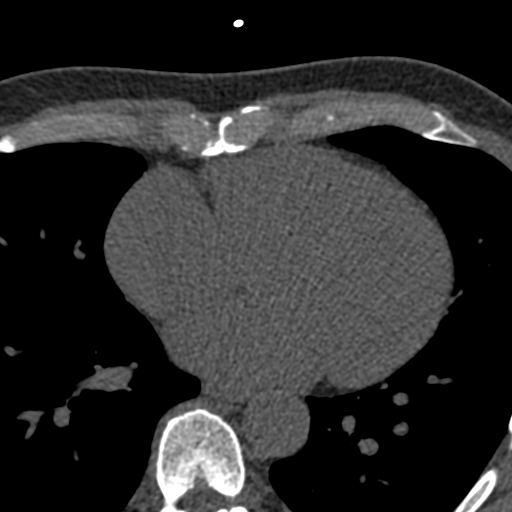
[im 46/77  vessel]
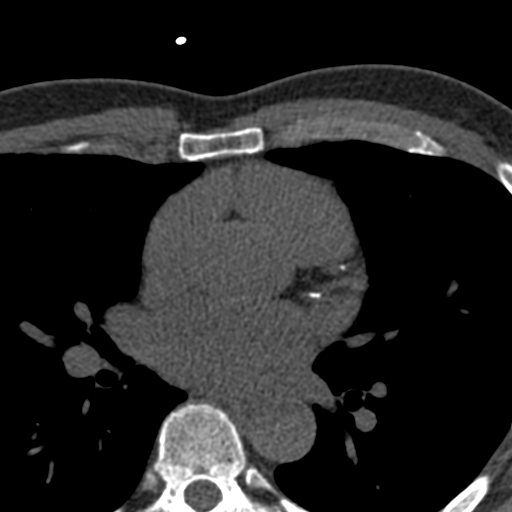
[im 61/77  vessel]
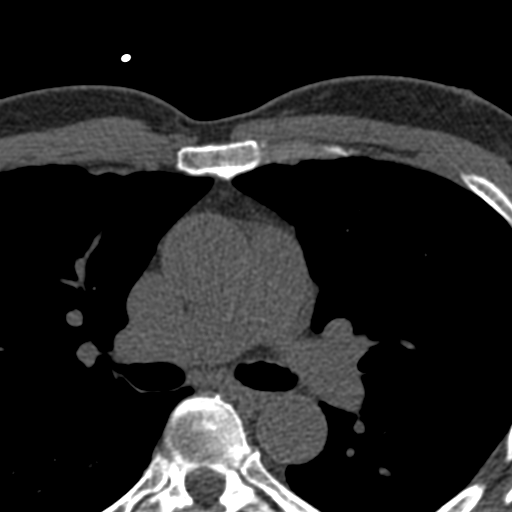

[Series 3: calcium scoring 2.00 br40 bestdiast 68% fov · axial · 0.56mm/px · z∈[+1711,+1813]mm · 5 of 77 slices shown, 7 images]
[im 13/77  vessel]
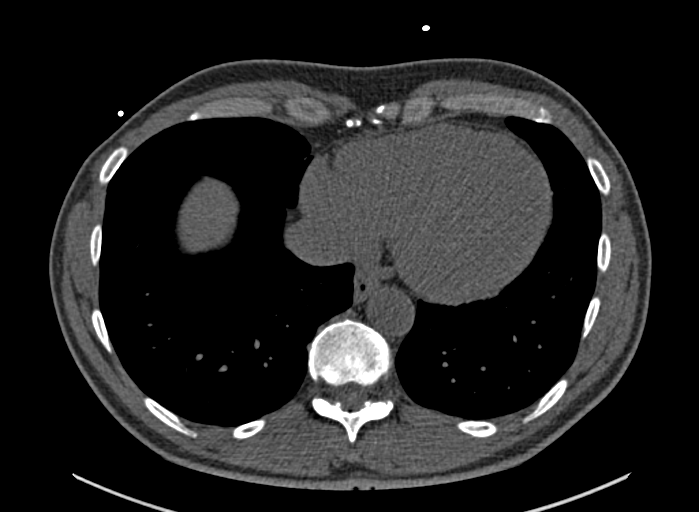
[im 13/77  lung]
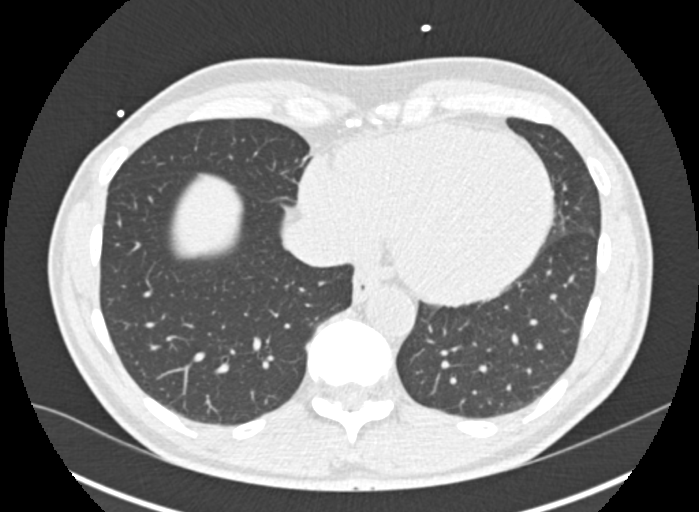
[im 26/77  vessel]
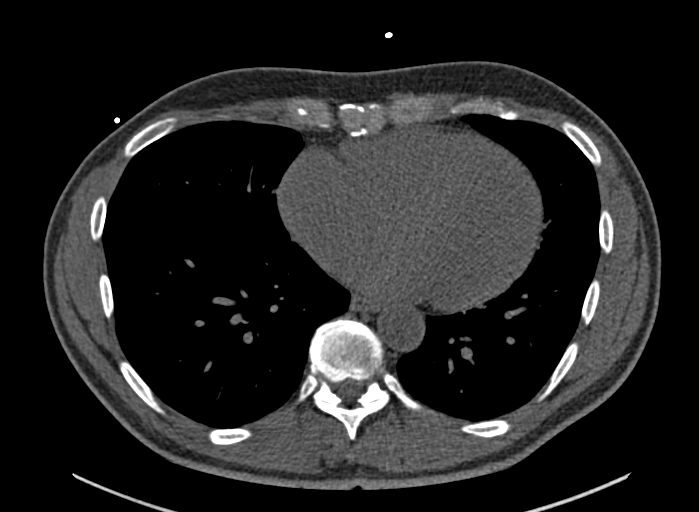
[im 39/77  vessel]
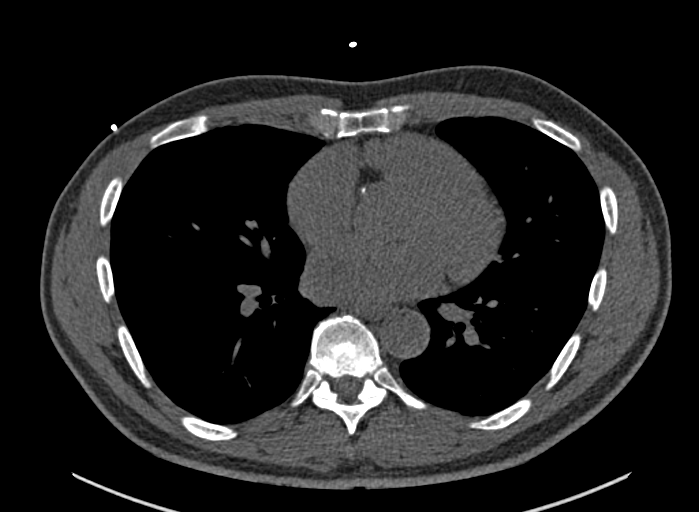
[im 51/77  vessel]
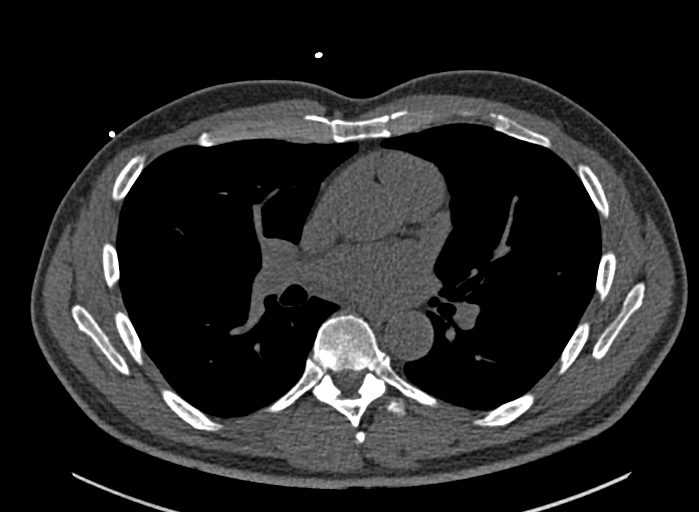
[im 64/77  vessel]
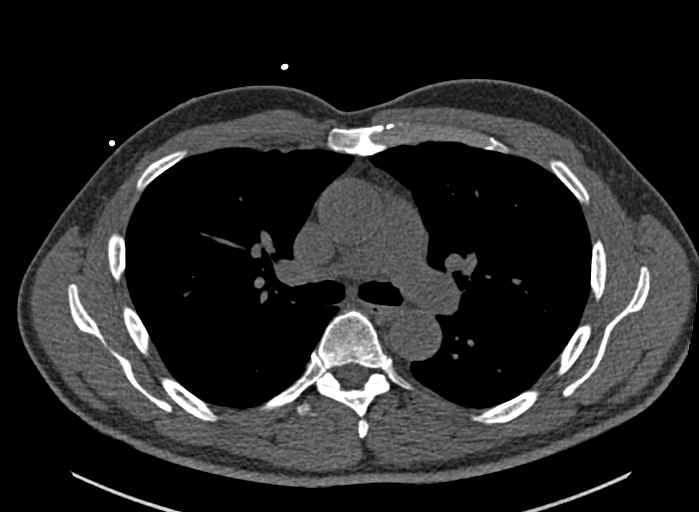
[im 64/77  lung]
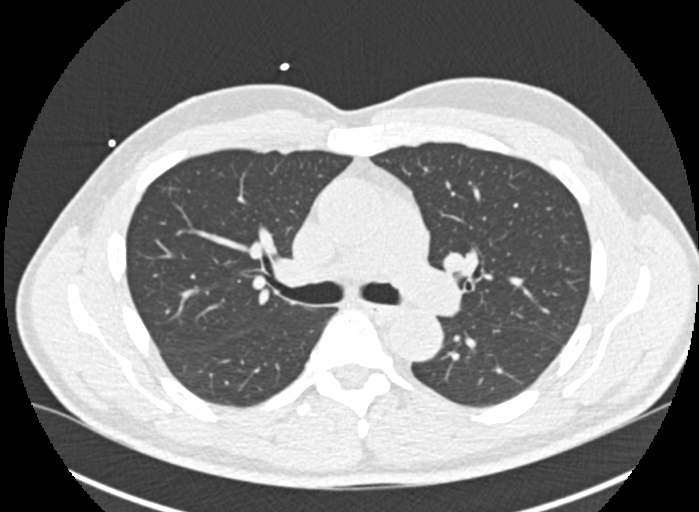

[Series 9: calcium scoring 2.00 br60 bestdiast 68% fov · axial · 0.56mm/px · z∈[+1711,+1813]mm · 5 of 77 slices shown]
[im 13/77  vessel]
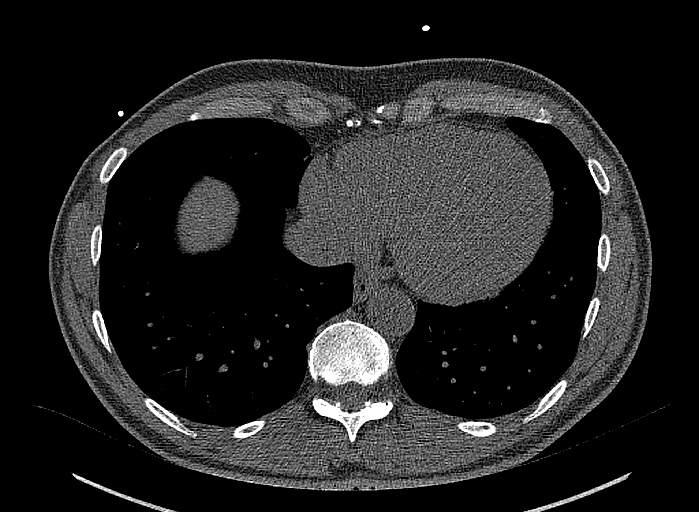
[im 26/77  vessel]
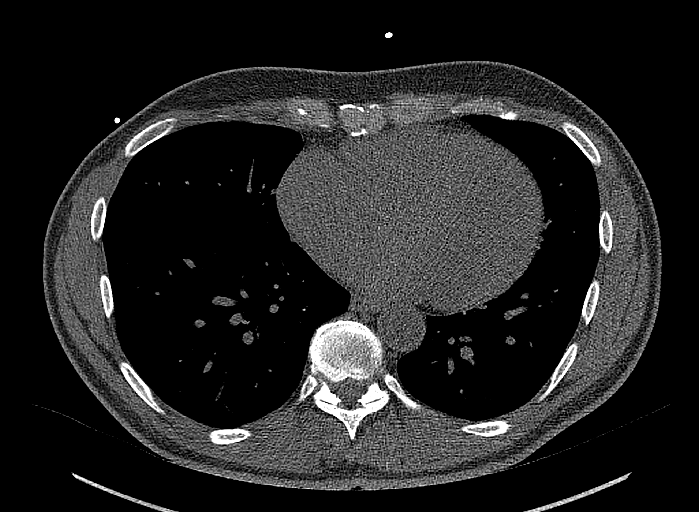
[im 39/77  vessel]
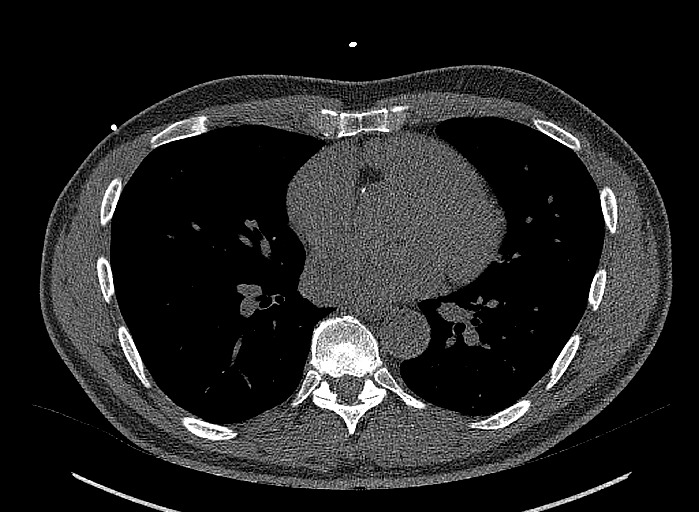
[im 51/77  vessel]
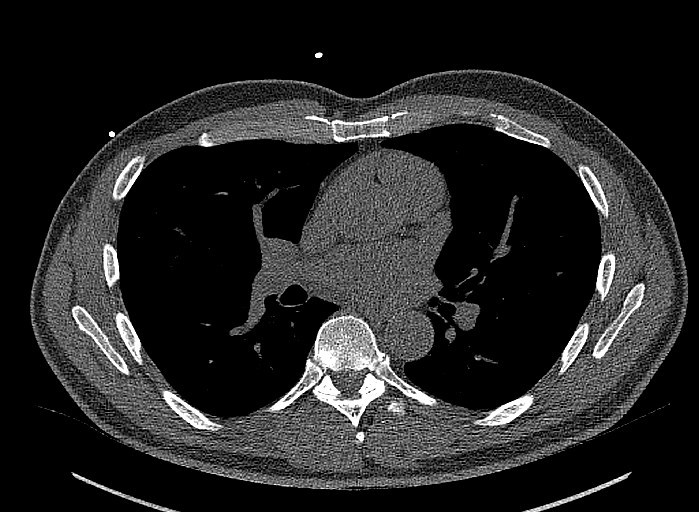
[im 64/77  vessel]
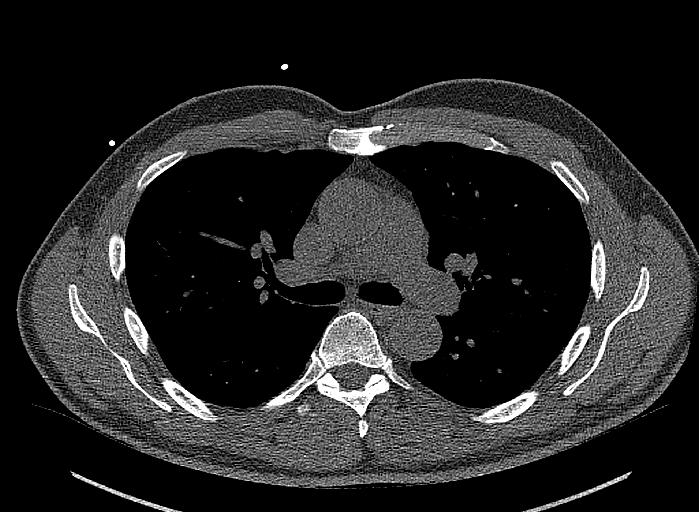

[14 of 20 positions shown; findings below may reference images not displayed]

FINDINGS: Technical quality: Good.

CORONARY CALCIUM

Total Agatston Score: 158 with calcifications in the left anterior
descending, left circumflex and right coronary arteries.

[HOSPITAL] percentile:  63

OTHER FINDINGS:

Cardiovascular: Aorta is normal caliber.  Heart is normal size.

Mediastinum/Nodes: No adenopathy in the lower mediastinum or hila.

Lungs/Pleura: No confluent opacities or effusions.

Upper Abdomen: Imaging into the upper abdomen shows no acute
findings. Low-density lesion in the right hepatic dome measures 2
cm, likely cyst.

Musculoskeletal: Chest wall soft tissues are unremarkable. No acute
bony abnormality.
IMPRESSION: The observed calcium score of 158 is at the percentile 63 for
subjects of the same age, gender and race/ethnicity who are free of
clinical cardiovascular disease and treated diabetes.

No acute or significant extracardiac abnormality

## 2020-10-14 DIAGNOSIS — Z1283 Encounter for screening for malignant neoplasm of skin: Secondary | ICD-10-CM | POA: Diagnosis not present

## 2020-10-14 DIAGNOSIS — D2272 Melanocytic nevi of left lower limb, including hip: Secondary | ICD-10-CM | POA: Diagnosis not present

## 2020-10-14 DIAGNOSIS — D225 Melanocytic nevi of trunk: Secondary | ICD-10-CM | POA: Diagnosis not present

## 2020-10-14 DIAGNOSIS — D485 Neoplasm of uncertain behavior of skin: Secondary | ICD-10-CM | POA: Diagnosis not present

## 2020-11-04 DIAGNOSIS — D2272 Melanocytic nevi of left lower limb, including hip: Secondary | ICD-10-CM | POA: Diagnosis not present

## 2020-11-04 DIAGNOSIS — D485 Neoplasm of uncertain behavior of skin: Secondary | ICD-10-CM | POA: Diagnosis not present

## 2021-01-20 DIAGNOSIS — D224 Melanocytic nevi of scalp and neck: Secondary | ICD-10-CM | POA: Diagnosis not present

## 2021-01-20 DIAGNOSIS — L821 Other seborrheic keratosis: Secondary | ICD-10-CM | POA: Diagnosis not present

## 2021-01-20 DIAGNOSIS — Z1283 Encounter for screening for malignant neoplasm of skin: Secondary | ICD-10-CM | POA: Diagnosis not present

## 2021-01-20 DIAGNOSIS — D485 Neoplasm of uncertain behavior of skin: Secondary | ICD-10-CM | POA: Diagnosis not present

## 2021-03-05 DIAGNOSIS — N401 Enlarged prostate with lower urinary tract symptoms: Secondary | ICD-10-CM | POA: Diagnosis not present

## 2021-03-05 DIAGNOSIS — Z13220 Encounter for screening for lipoid disorders: Secondary | ICD-10-CM | POA: Diagnosis not present

## 2021-03-05 DIAGNOSIS — Z8042 Family history of malignant neoplasm of prostate: Secondary | ICD-10-CM | POA: Diagnosis not present

## 2021-03-05 DIAGNOSIS — I341 Nonrheumatic mitral (valve) prolapse: Secondary | ICD-10-CM | POA: Diagnosis not present

## 2021-03-05 DIAGNOSIS — Z125 Encounter for screening for malignant neoplasm of prostate: Secondary | ICD-10-CM | POA: Diagnosis not present

## 2021-03-05 DIAGNOSIS — R209 Unspecified disturbances of skin sensation: Secondary | ICD-10-CM | POA: Diagnosis not present

## 2021-03-05 DIAGNOSIS — K5730 Diverticulosis of large intestine without perforation or abscess without bleeding: Secondary | ICD-10-CM | POA: Diagnosis not present

## 2021-03-05 DIAGNOSIS — Z79899 Other long term (current) drug therapy: Secondary | ICD-10-CM | POA: Diagnosis not present

## 2021-03-05 DIAGNOSIS — K4021 Bilateral inguinal hernia, without obstruction or gangrene, recurrent: Secondary | ICD-10-CM | POA: Diagnosis not present

## 2021-03-05 DIAGNOSIS — M5412 Radiculopathy, cervical region: Secondary | ICD-10-CM | POA: Diagnosis not present

## 2021-03-05 DIAGNOSIS — E559 Vitamin D deficiency, unspecified: Secondary | ICD-10-CM | POA: Diagnosis not present

## 2021-04-21 DIAGNOSIS — Z1283 Encounter for screening for malignant neoplasm of skin: Secondary | ICD-10-CM | POA: Diagnosis not present

## 2021-04-21 DIAGNOSIS — D2272 Melanocytic nevi of left lower limb, including hip: Secondary | ICD-10-CM | POA: Diagnosis not present

## 2021-04-21 DIAGNOSIS — D225 Melanocytic nevi of trunk: Secondary | ICD-10-CM | POA: Diagnosis not present

## 2021-04-21 DIAGNOSIS — D485 Neoplasm of uncertain behavior of skin: Secondary | ICD-10-CM | POA: Diagnosis not present

## 2021-09-29 DIAGNOSIS — L820 Inflamed seborrheic keratosis: Secondary | ICD-10-CM | POA: Diagnosis not present

## 2021-09-29 DIAGNOSIS — L821 Other seborrheic keratosis: Secondary | ICD-10-CM | POA: Diagnosis not present

## 2021-09-29 DIAGNOSIS — D225 Melanocytic nevi of trunk: Secondary | ICD-10-CM | POA: Diagnosis not present

## 2021-09-29 DIAGNOSIS — Z1283 Encounter for screening for malignant neoplasm of skin: Secondary | ICD-10-CM | POA: Diagnosis not present

## 2021-09-29 DIAGNOSIS — D485 Neoplasm of uncertain behavior of skin: Secondary | ICD-10-CM | POA: Diagnosis not present

## 2022-04-21 DIAGNOSIS — E7800 Pure hypercholesterolemia, unspecified: Secondary | ICD-10-CM | POA: Diagnosis not present

## 2022-04-21 DIAGNOSIS — N401 Enlarged prostate with lower urinary tract symptoms: Secondary | ICD-10-CM | POA: Diagnosis not present

## 2022-04-21 DIAGNOSIS — K5730 Diverticulosis of large intestine without perforation or abscess without bleeding: Secondary | ICD-10-CM | POA: Diagnosis not present

## 2022-04-21 DIAGNOSIS — Z1329 Encounter for screening for other suspected endocrine disorder: Secondary | ICD-10-CM | POA: Diagnosis not present

## 2022-04-21 DIAGNOSIS — E559 Vitamin D deficiency, unspecified: Secondary | ICD-10-CM | POA: Diagnosis not present

## 2022-04-21 DIAGNOSIS — Z23 Encounter for immunization: Secondary | ICD-10-CM | POA: Diagnosis not present

## 2022-04-21 DIAGNOSIS — I2510 Atherosclerotic heart disease of native coronary artery without angina pectoris: Secondary | ICD-10-CM | POA: Diagnosis not present

## 2022-04-21 DIAGNOSIS — Z79899 Other long term (current) drug therapy: Secondary | ICD-10-CM | POA: Diagnosis not present

## 2022-04-21 DIAGNOSIS — M5412 Radiculopathy, cervical region: Secondary | ICD-10-CM | POA: Diagnosis not present

## 2022-04-21 DIAGNOSIS — Z8042 Family history of malignant neoplasm of prostate: Secondary | ICD-10-CM | POA: Diagnosis not present

## 2022-04-21 DIAGNOSIS — Z1331 Encounter for screening for depression: Secondary | ICD-10-CM | POA: Diagnosis not present

## 2022-04-21 DIAGNOSIS — Z0000 Encounter for general adult medical examination without abnormal findings: Secondary | ICD-10-CM | POA: Diagnosis not present

## 2022-05-05 DIAGNOSIS — Z1283 Encounter for screening for malignant neoplasm of skin: Secondary | ICD-10-CM | POA: Diagnosis not present

## 2022-05-05 DIAGNOSIS — D225 Melanocytic nevi of trunk: Secondary | ICD-10-CM | POA: Diagnosis not present

## 2022-06-03 DIAGNOSIS — E7800 Pure hypercholesterolemia, unspecified: Secondary | ICD-10-CM | POA: Diagnosis not present

## 2022-06-03 DIAGNOSIS — R972 Elevated prostate specific antigen [PSA]: Secondary | ICD-10-CM | POA: Diagnosis not present

## 2022-11-23 DIAGNOSIS — Z1283 Encounter for screening for malignant neoplasm of skin: Secondary | ICD-10-CM | POA: Diagnosis not present

## 2022-11-23 DIAGNOSIS — B078 Other viral warts: Secondary | ICD-10-CM | POA: Diagnosis not present

## 2022-11-23 DIAGNOSIS — D485 Neoplasm of uncertain behavior of skin: Secondary | ICD-10-CM | POA: Diagnosis not present

## 2022-11-23 DIAGNOSIS — D2262 Melanocytic nevi of left upper limb, including shoulder: Secondary | ICD-10-CM | POA: Diagnosis not present

## 2022-11-23 DIAGNOSIS — D225 Melanocytic nevi of trunk: Secondary | ICD-10-CM | POA: Diagnosis not present

## 2022-11-23 DIAGNOSIS — D2272 Melanocytic nevi of left lower limb, including hip: Secondary | ICD-10-CM | POA: Diagnosis not present

## 2023-02-15 DIAGNOSIS — Z1283 Encounter for screening for malignant neoplasm of skin: Secondary | ICD-10-CM | POA: Diagnosis not present

## 2023-02-15 DIAGNOSIS — D485 Neoplasm of uncertain behavior of skin: Secondary | ICD-10-CM | POA: Diagnosis not present

## 2023-02-15 DIAGNOSIS — D225 Melanocytic nevi of trunk: Secondary | ICD-10-CM | POA: Diagnosis not present

## 2023-05-24 DIAGNOSIS — R972 Elevated prostate specific antigen [PSA]: Secondary | ICD-10-CM | POA: Diagnosis not present

## 2023-05-27 ENCOUNTER — Other Ambulatory Visit: Payer: Self-pay | Admitting: Urology

## 2023-05-27 DIAGNOSIS — R972 Elevated prostate specific antigen [PSA]: Secondary | ICD-10-CM

## 2023-05-31 DIAGNOSIS — H25813 Combined forms of age-related cataract, bilateral: Secondary | ICD-10-CM | POA: Diagnosis not present

## 2023-05-31 DIAGNOSIS — H35413 Lattice degeneration of retina, bilateral: Secondary | ICD-10-CM | POA: Diagnosis not present

## 2023-05-31 DIAGNOSIS — H524 Presbyopia: Secondary | ICD-10-CM | POA: Diagnosis not present

## 2023-05-31 DIAGNOSIS — H52213 Irregular astigmatism, bilateral: Secondary | ICD-10-CM | POA: Diagnosis not present

## 2023-05-31 DIAGNOSIS — H18891 Other specified disorders of cornea, right eye: Secondary | ICD-10-CM | POA: Diagnosis not present

## 2023-05-31 DIAGNOSIS — H0288B Meibomian gland dysfunction left eye, upper and lower eyelids: Secondary | ICD-10-CM | POA: Diagnosis not present

## 2023-05-31 DIAGNOSIS — H18451 Nodular corneal degeneration, right eye: Secondary | ICD-10-CM | POA: Diagnosis not present

## 2023-05-31 DIAGNOSIS — H0288A Meibomian gland dysfunction right eye, upper and lower eyelids: Secondary | ICD-10-CM | POA: Diagnosis not present

## 2023-05-31 DIAGNOSIS — H35363 Drusen (degenerative) of macula, bilateral: Secondary | ICD-10-CM | POA: Diagnosis not present

## 2023-07-08 ENCOUNTER — Ambulatory Visit
Admission: RE | Admit: 2023-07-08 | Discharge: 2023-07-08 | Disposition: A | Discharge status: Home / Self Care | Payer: PPO | Source: Ambulatory Visit | Attending: Urology | Admitting: Urology

## 2023-07-08 DIAGNOSIS — R972 Elevated prostate specific antigen [PSA]: Secondary | ICD-10-CM

## 2023-07-08 MED ORDER — GADOPICLENOL 0.5 MMOL/ML IV SOLN
9.0000 mL | Freq: Once | INTRAVENOUS | Status: AC | PRN
  Administered 2023-07-08: 9 mL via INTRAVENOUS

## 2023-07-27 DIAGNOSIS — H2512 Age-related nuclear cataract, left eye: Secondary | ICD-10-CM | POA: Diagnosis not present

## 2023-07-27 DIAGNOSIS — H25813 Combined forms of age-related cataract, bilateral: Secondary | ICD-10-CM | POA: Diagnosis not present

## 2023-08-09 DIAGNOSIS — H25811 Combined forms of age-related cataract, right eye: Secondary | ICD-10-CM | POA: Diagnosis not present

## 2023-08-17 DIAGNOSIS — H25811 Combined forms of age-related cataract, right eye: Secondary | ICD-10-CM | POA: Diagnosis not present

## 2023-08-17 DIAGNOSIS — H2511 Age-related nuclear cataract, right eye: Secondary | ICD-10-CM | POA: Diagnosis not present

## 2023-09-21 DIAGNOSIS — H35353 Cystoid macular degeneration, bilateral: Secondary | ICD-10-CM | POA: Diagnosis not present

## 2023-09-21 DIAGNOSIS — H35363 Drusen (degenerative) of macula, bilateral: Secondary | ICD-10-CM | POA: Diagnosis not present

## 2023-09-21 DIAGNOSIS — H35413 Lattice degeneration of retina, bilateral: Secondary | ICD-10-CM | POA: Diagnosis not present

## 2023-11-08 DIAGNOSIS — D225 Melanocytic nevi of trunk: Secondary | ICD-10-CM | POA: Diagnosis not present

## 2023-11-08 DIAGNOSIS — D485 Neoplasm of uncertain behavior of skin: Secondary | ICD-10-CM | POA: Diagnosis not present

## 2023-11-08 DIAGNOSIS — Z1283 Encounter for screening for malignant neoplasm of skin: Secondary | ICD-10-CM | POA: Diagnosis not present

## 2023-11-22 DIAGNOSIS — H35413 Lattice degeneration of retina, bilateral: Secondary | ICD-10-CM | POA: Diagnosis not present

## 2023-11-22 DIAGNOSIS — H26493 Other secondary cataract, bilateral: Secondary | ICD-10-CM | POA: Diagnosis not present

## 2023-11-22 DIAGNOSIS — Z961 Presence of intraocular lens: Secondary | ICD-10-CM | POA: Diagnosis not present

## 2023-11-22 DIAGNOSIS — H04123 Dry eye syndrome of bilateral lacrimal glands: Secondary | ICD-10-CM | POA: Diagnosis not present

## 2024-01-30 ENCOUNTER — Ambulatory Visit (INDEPENDENT_AMBULATORY_CARE_PROVIDER_SITE_OTHER): Admitting: Otolaryngology

## 2024-01-30 ENCOUNTER — Encounter (INDEPENDENT_AMBULATORY_CARE_PROVIDER_SITE_OTHER): Payer: Self-pay | Admitting: Otolaryngology

## 2024-01-30 VITALS — BP 160/87 | HR 68 | Ht 72.0 in | Wt 178.0 lb

## 2024-01-30 DIAGNOSIS — R040 Epistaxis: Secondary | ICD-10-CM | POA: Diagnosis not present

## 2024-01-30 DIAGNOSIS — J3489 Other specified disorders of nose and nasal sinuses: Secondary | ICD-10-CM

## 2024-01-30 NOTE — Progress Notes (Unsigned)
 CC: Recurrent left epistaxis  HPI: Discussed the use of AI scribe software for clinical note transcription with the patient, who gave verbal consent to proceed.  History of Present Illness Devon Sawyer is a 68 year old male who presents today complaining of recurrent nosebleeds.   He has experienced epistaxis four times over the past week and a half. The episodes occur bilaterally, with the left side being more affected. No recent trauma to the face or nose has been reported, and he has not undergone any ear, nose, or throat surgeries, although he had oral surgery five or six years ago to remove wisdom teeth.  He feels as if his nasal passages have been getting clogged over the last six months. He denies using any nasal sprays and is not on any blood thinners except for a baby aspirin, which he stopped taking on Friday to help with the epistaxis.  [place] He does not use a humidifier at home.   Past Medical History:  Diagnosis Date   H/O bladder problems     Past Surgical History:  Procedure Laterality Date   COLONOSCOPY WITH PROPOFOL  N/A 08/09/2016   Procedure: COLONOSCOPY WITH PROPOFOL ;  Surgeon: Vicci Gladis POUR, MD;  Location: [facility];  Service: Endoscopy;  Laterality: N/A;   EYE SURGERY  2016,2010,2005   FETAL SURGERY FOR CONGENITAL HERNIA  2013    Family History  Problem Relation Age of Onset   Cancer Father     Social History:  reports that he has never smoked. He has never used smokeless tobacco. He reports current alcohol use. He reports that he does not use drugs.  Allergies:  Allergies  Allergen Reactions   Penicillins Itching    Has patient had a PCN reaction causing immediate rash, facial/tongue/throat swelling, SOB or lightheadedness with hypotension: No Has patient had a PCN reaction causing severe rash involving mucus membranes or skin necrosis: No Has patient had a PCN reaction that required hospitalization No Has  patient had a PCN reaction occurring within the last 10 years: No If all of the above answers are NO, then may proceed with Cephalosporin use.     Prior to Admission medications   Medication Sig Start Date End Date Taking? Authorizing Provider  acetaminophen (TYLENOL) 325 MG tablet Take 650 mg by mouth every 6 (six) hours as needed.   Yes [provider]  cholecalciferol (VITAMIN D) 1000 units tablet Take 1,000 Units by mouth daily.   Yes [provider]  Omega-3 Fatty Acids (FISH OIL) 1200 MG CAPS Take 1,200 mg by mouth daily.   Yes [provider]  tamsulosin (FLOMAX) 0.4 MG CAPS capsule take 0.4mg  by mouth at supper 05/13/16  Yes [provider]    Blood pressure (!) 160/87, pulse 68, height 6' (1.829 m), weight 178 lb (80.7 kg), SpO2 97%. Exam: General: Communicates without difficulty, well nourished, no acute distress. Head: Normocephalic, no evidence injury, no tenderness, facial buttresses intact without stepoff. Face/sinus: No tenderness to palpation and percussion. Facial movement is normal and symmetric. Eyes: PERRL, EOMI. No scleral icterus, conjunctivae clear. Neuro: CN II exam reveals vision grossly intact.  No nystagmus at any point of gaze. Ears: Auricles well formed without lesions.  Ear canals are intact without mass or lesion.  No erythema or edema is appreciated.  The TMs are intact without fluid. Nose: External evaluation reveals normal support and skin without lesions.  Dorsum is intact.  Anterior rhinoscopy reveals hypervascular  areas on the left nasal septum.  Oral:  Oral cavity and oropharynx are intact, symmetric, without erythema or edema.  Mucosa is moist without lesions. Neck: Full range of motion without pain.  There is no significant lymphadenopathy.  No masses palpable.  Thyroid  bed within normal limits to palpation.  Parotid glands and submandibular glands equal bilaterally without mass.  Trachea is midline. Neuro:  CN 2-12 grossly  intact.   Procedure:  Endoscopic control of recurrent left epistaxis. Indication:  Recurrent epistaxis  Description:  The left nasal cavity is sprayed with topical xylocaine  and neo-synephrine.  After adequate anesthesia is achieved, the nasal cavity is examined with a 0 rigid endoscope.  A suction catheter is inserted in parallel with the 0 endoscope, and it is used to suction blood clots from the nasal cavity.  Several hypervascular areas are noted on the anterior and superior portion of the septum. Active bleeding is noted. A silver nitrate stick is inserted in parallel with the 0 endoscope.  It is used to repeatedly cauterized the hypervascular areas.  Good hemostasis is achieved.  The patient tolerated the procedure well.    Assessment and Plan Assessment & Plan Recurrent left epistaxis Recurrent epistaxis over the past week and a half, primarily from the left side. No recent trauma or surgery to the ear, nose, or throat. He is on baby aspirin, which he stopped on Friday to help with the bleeding. Examination revealed several bleeding blood vessels, primarily on the left side. - Endoscopic cauterization of the left nasal septum. - Advise use of a humidifier at home, especially during winter, to prevent nasal dryness. - Hold baby aspirin for about a week before resuming. - Record any future episodes of bleeding, noting the side of occurrence. - Schedule a follow-up appointment in one month to reassess.  Nasal dryness Nasal dryness likely contributing to epistaxis, exacerbated by colder weather and use of indoor heating. - Advise use of a humidifier at night to maintain nasal moisture. - Recommend applying ointment or saline in the nose if it feels very dry.   Zakai Gonyea W Osie Merkin 01/30/2024, 3:45 PM

## 2024-01-31 DIAGNOSIS — R040 Epistaxis: Secondary | ICD-10-CM | POA: Insufficient documentation

## 2024-02-17 ENCOUNTER — Encounter (INDEPENDENT_AMBULATORY_CARE_PROVIDER_SITE_OTHER): Payer: Self-pay | Admitting: Otolaryngology

## 2024-02-17 ENCOUNTER — Ambulatory Visit (INDEPENDENT_AMBULATORY_CARE_PROVIDER_SITE_OTHER): Admitting: Otolaryngology

## 2024-02-17 VITALS — BP 138/71 | HR 70 | Temp 97.8°F | Ht 72.0 in | Wt 181.0 lb

## 2024-02-17 DIAGNOSIS — R040 Epistaxis: Secondary | ICD-10-CM | POA: Diagnosis not present

## 2024-02-17 NOTE — Progress Notes (Signed)
 Patient ID: Devon Sawyer, male   DOB: May 16, 1955, 68 y.o.   MRN: [id number]  Procedure:  Endoscopic control of recurrent left epistaxis  Indication: The patient is a 68 year old male who returns today complaining of more recurrent left-sided epistaxis.  He was last seen in October 2025.  At that time, he was noted to have recurrent left epistaxis, with several hypervascular areas on the left nasal septum.  He was treated with endoscopic cauterization of the hypervascular areas.  According to the patient, he continues to have recurrent left-sided epistaxis.  He had an episode of severe bleeding last night.  He denies any recent nasal trauma.  Description:  The left nasal cavity is sprayed with topical xylocaine  and neo-synephrine.  After adequate anesthesia is achieved, the nasal cavity is examined with a 0 rigid endoscope.  A suction catheter is inserted in parallel with the 0 endoscope, and it is used to suction blood clots from the left nasal cavity.  Hypervascular areas are again noted at the anterior and superior aspect of the nasal septum.  A silver nitrate stick is inserted in parallel with the 0 endoscope.  It is used to repeatedly cauterized the hypervascular areas.  Good hemostasis is achieved.  The patient tolerated the procedure well.  Assessment: 1.  Hypervascular areas are again noted on the left anterior and superior nasal septum.   2.  Active severe bleeding is noted today.    Plan: 1. Endoscopic cauterization of the left anterior and superior nasal septum. 2. The nasal endoscopy findings are reviewed with the patient. 3. Nasal ointment/humidifier to treat the nasal dryness. 4. The patient will return for re-evaluation in 1 month.

## 2024-03-08 ENCOUNTER — Ambulatory Visit (INDEPENDENT_AMBULATORY_CARE_PROVIDER_SITE_OTHER): Admitting: Otolaryngology

## 2024-03-08 ENCOUNTER — Encounter (INDEPENDENT_AMBULATORY_CARE_PROVIDER_SITE_OTHER): Payer: Self-pay | Admitting: Otolaryngology

## 2024-03-08 VITALS — HR 52 | Temp 97.7°F | Ht 72.0 in | Wt 180.0 lb

## 2024-03-08 DIAGNOSIS — R040 Epistaxis: Secondary | ICD-10-CM

## 2024-03-08 NOTE — Progress Notes (Unsigned)
 Patient ID: Devon Sawyer, male   DOB: [birth year], 67 y.o.   MRN: [id number]  Follow up: Recurrent left epistaxis  History of Present Illness Devon Sawyer is a 68 year old male who returns today for his follow-up evaluation.  He was previously seen for recurrent left epistaxis.  He was treated with endoscopic cauterization of his left nasal septum.  He has experienced nasal bleeding characterized by minor blood seepage when blowing his nose, but no significant epistaxis over the past month. This is an improvement compared to previous episodes a couple of months ago.  He uses a humidifier and applies a saline gel at bedtime, along with a nasal spray three times a day to maintain nasal moisture.  Exam: General: Communicates without difficulty, well nourished, no acute distress. Head: Normocephalic, no evidence injury, no tenderness, facial buttresses intact without stepoff. Face/sinus: No tenderness to palpation and percussion. Facial movement is normal and symmetric. Eyes: PERRL, EOMI. No scleral icterus, conjunctivae clear. Neuro: CN II exam reveals vision grossly intact.  No nystagmus at any point of gaze. Ears: Auricles well formed without lesions.  Ear canals are intact without mass or lesion.  No erythema or edema is appreciated.  The TMs are intact without fluid. Nose: External evaluation reveals normal support and skin without lesions.  Dorsum is intact.  Anterior rhinoscopy reveals the left nasal septum to be healing well.  No bleeding is noted.  Oral:  Oral cavity and oropharynx are intact, symmetric, without erythema or edema.  Mucosa is moist without lesions. Neck: Full range of motion without pain.  There is no significant lymphadenopathy.  No masses palpable.  Thyroid  bed within normal limits to palpation.  Parotid glands and submandibular glands equal bilaterally without mass.  Trachea is midline. Neuro:  CN 2-12 grossly intact.    Assessment and Plan Assessment & Plan Recurrent left  epistaxis Recurrent left-sided epistaxis with improvement. Currently experiencing minor seepage when blowing nose, but no significant bleeding. Examination reveals dry scab in the left nostril, but no major issues.  - Continue using a humidifier to maintain moisture in the air. - Apply saline gel at bedtime and saline spray three times a day to keep nasal passages moist. - Return for evaluation if epistaxis worsens.
